# Patient Record
Sex: Male | Born: 1990 | Race: Black or African American | Hispanic: No | Marital: Married | Smoking: Never smoker
Health system: Southern US, Community
[De-identification: ages and names within clinical notes are randomized; demographics above are authoritative.]

## PROBLEM LIST (undated history)

## (undated) DIAGNOSIS — N189 Chronic kidney disease, unspecified: Secondary | ICD-10-CM

## (undated) DIAGNOSIS — I10 Essential (primary) hypertension: Secondary | ICD-10-CM

## (undated) DIAGNOSIS — E119 Type 2 diabetes mellitus without complications: Secondary | ICD-10-CM

## (undated) HISTORY — PX: APPENDECTOMY: SHX54

## (undated) HISTORY — PX: WISDOM TOOTH EXTRACTION: SHX21

---

## 2014-09-15 ENCOUNTER — Other Ambulatory Visit: Payer: Self-pay | Admitting: Sports Medicine

## 2014-09-15 DIAGNOSIS — S233XXS Sprain of ligaments of thoracic spine, sequela: Secondary | ICD-10-CM

## 2014-09-15 DIAGNOSIS — S29012S Strain of muscle and tendon of back wall of thorax, sequela: Principal | ICD-10-CM

## 2014-10-01 ENCOUNTER — Other Ambulatory Visit: Payer: Self-pay

## 2014-10-22 ENCOUNTER — Ambulatory Visit
Admission: RE | Admit: 2014-10-22 | Discharge: 2014-10-22 | Disposition: A | Discharge status: Home / Self Care | Payer: Self-pay | Source: Ambulatory Visit | Attending: Sports Medicine | Admitting: Sports Medicine

## 2014-10-22 DIAGNOSIS — S29012S Strain of muscle and tendon of back wall of thorax, sequela: Principal | ICD-10-CM

## 2014-10-22 DIAGNOSIS — S233XXS Sprain of ligaments of thoracic spine, sequela: Secondary | ICD-10-CM

## 2014-10-25 ENCOUNTER — Other Ambulatory Visit (HOSPITAL_COMMUNITY): Payer: Self-pay | Admitting: Sports Medicine

## 2014-10-25 DIAGNOSIS — IMO0001 Reserved for inherently not codable concepts without codable children: Secondary | ICD-10-CM

## 2014-11-02 NOTE — Pre-Procedure Instructions (Signed)
    Nestor LewandowskyKenneth Crall  11/02/2014      [facility] - [place], [place] - [id number] [place] [id number] Phone: [phone] Fax: [phone]    Your procedure is scheduled on Tuesday, July 12th, at 10:00 AM.  [facility] Ad[facility] at 8:00 A.M.  Call this number if you have problems the morning of surgery:  [phone]   Remember:  Do not eat food or drink liquids after midnight.   Take these medicines the morning of surgery with A SIP OF WATER Amlodipine (Norvasc).  **Please call PCP or endocrinologist about insulin pump rate.**  Stop taking: NSAIDs, Aspirin, Ibuprofen, Aleve, Naproxen, BC's, Goody's, Fish Oil, herbal medications, and vitamins.    Do not wear jewelry.  Do not wear lotions, powders, or colognes.  You may wear deodorant.  Men may shave face and neck.  Do not bring valuables to the hospital.  [facility] is not responsible for any belongings or valuables.  Contacts, dentures or bridgework may not be worn into surgery.  Leave your suitcase in the car.  After surgery it may be brought to your room.  For patients admitted to the hospital, discharge time will be determined by your treatment team.  Patients discharged the day of surgery will not be allowed to drive home.   Special instructions:  See attached.   Please read over the following fact sheets that you were given. Pain Booklet, Coughing and Deep Breathing and Surgical Site Infection Prevention

## 2014-11-03 ENCOUNTER — Encounter (HOSPITAL_COMMUNITY)
Admission: RE | Admit: 2014-11-03 | Discharge: 2014-11-03 | Disposition: A | Discharge status: Home / Self Care | Payer: 59 | Source: Ambulatory Visit | Attending: Sports Medicine | Admitting: Sports Medicine

## 2014-11-03 ENCOUNTER — Encounter (HOSPITAL_COMMUNITY): Payer: Self-pay | Admitting: Emergency Medicine

## 2014-11-03 ENCOUNTER — Encounter (HOSPITAL_COMMUNITY): Payer: Self-pay | Admitting: Anesthesiology

## 2014-11-03 ENCOUNTER — Encounter (HOSPITAL_COMMUNITY): Payer: Self-pay

## 2014-11-03 DIAGNOSIS — I10 Essential (primary) hypertension: Secondary | ICD-10-CM | POA: Insufficient documentation

## 2014-11-03 DIAGNOSIS — E119 Type 2 diabetes mellitus without complications: Secondary | ICD-10-CM | POA: Insufficient documentation

## 2014-11-03 DIAGNOSIS — Z01812 Encounter for preprocedural laboratory examination: Secondary | ICD-10-CM | POA: Diagnosis present

## 2014-11-03 HISTORY — DX: Type 2 diabetes mellitus without complications: E11.9

## 2014-11-03 HISTORY — DX: Essential (primary) hypertension: I10

## 2014-11-03 LAB — BASIC METABOLIC PANEL
ANION GAP: 9 (ref 5–15)
BUN: 13 mg/dL (ref 6–20)
CO2: 26 mmol/L (ref 22–32)
Calcium: 9.2 mg/dL (ref 8.9–10.3)
Chloride: 103 mmol/L (ref 101–111)
Creatinine, Ser: 1.52 mg/dL — ABNORMAL HIGH (ref 0.61–1.24)
GFR calc Af Amer: >60 mL/min (ref >60)
GFR calc non Af Amer: >60 mL/min (ref >60)
Glucose, Bld: 101 mg/dL — ABNORMAL HIGH (ref 65–99)
Potassium: 4.1 mmol/L (ref 3.5–5.1)
Sodium: 138 mmol/L (ref 135–145)

## 2014-11-03 LAB — CBC
HEMATOCRIT: 46.1 % (ref 39.0–52.0)
HEMOGLOBIN: 15.9 g/dL (ref 13.0–17.0)
MCH: 30.8 pg (ref 26.0–34.0)
MCHC: 34.5 g/dL (ref 30.0–36.0)
MCV: 89.3 fL (ref 78.0–100.0)
PLATELETS: 267 10*3/uL (ref 150–400)
RBC: 5.16 MIL/uL (ref 4.22–5.81)
RDW: 12.4 % (ref 11.5–15.5)
WBC: 6.8 10*3/uL (ref 4.0–10.5)

## 2014-11-03 LAB — GLUCOSE, CAPILLARY
Glucose-Capillary: 66 mg/dL (ref 65–99)
Glucose-Capillary: 83 mg/dL (ref 65–99)

## 2014-11-03 NOTE — Progress Notes (Signed)
Pt. Has an insulin pump and states that he will contact his PCP prior to the procedure about adjust rate on pump.  Patient states he had to do that when he had his wisdom teeth removed.

## 2014-11-03 NOTE — Progress Notes (Signed)
Requested H&P from Dr. Hoy RegisterBassett's office.

## 2014-11-03 NOTE — Progress Notes (Signed)
Anesthesia Chart Review:  Pt is 24 year old male scheduled for thoracic spine MRI with anesthesia on 11/08/2014.  PMH includes: type 1 DM (insulin pump), HTN. Never smoker. BMI 30.   Medications include: amlodipine, accutane, lisinopril, novolog.  Pt will contact endocrinologist for guidance on insulin pump for procedure.   Preoperative labs reviewed.  Cr 1.52. HgbA1c pending.   EKG 11/03/2014: Sinus bradycardia (57 bpm). Nonspecific ST abnormality.   There is no H&P on file, but one has been requested from [facility].   Rica Mastngela Cherissa Hook, FNP-BC [facility]/Anesthesiology Phone: [phone] 11/03/2014 12:05 PM

## 2014-11-03 NOTE — Progress Notes (Signed)
PCP- Dr. Louis [facility]ckelParuchuri at [facility] Cardiologist - denies EKG - epic CXR - denies Echo/Stress Test/Cardiac Cath - denies

## 2014-11-03 NOTE — Progress Notes (Signed)
Patient's blood sugar was 66 when arriving to pre-admission appointment.  Patient drank a 4 oz ginger ale.  Rechecked blood sugar prior to leaving pre-admission appointment and patient's blood sugar increased to 83.

## 2014-11-04 LAB — HEMOGLOBIN A1C
HEMOGLOBIN A1C: 7.3 % — AB (ref 4.8–5.6)
MEAN PLASMA GLUCOSE: 163 mg/dL

## 2014-11-08 ENCOUNTER — Encounter (HOSPITAL_COMMUNITY): Admission: RE | Payer: Self-pay | Source: Ambulatory Visit

## 2014-11-08 ENCOUNTER — Ambulatory Visit (HOSPITAL_COMMUNITY): Admission: RE | Admit: 2014-11-08 | Payer: 59 | Source: Ambulatory Visit | Admitting: Sports Medicine

## 2014-11-08 ENCOUNTER — Ambulatory Visit (HOSPITAL_COMMUNITY): Payer: 59

## 2014-11-08 SURGERY — RADIOLOGY WITH ANESTHESIA
Anesthesia: General

## 2014-11-25 ENCOUNTER — Other Ambulatory Visit (HOSPITAL_COMMUNITY): Payer: Self-pay | Admitting: Sports Medicine

## 2014-11-25 DIAGNOSIS — IMO0001 Reserved for inherently not codable concepts without codable children: Secondary | ICD-10-CM

## 2014-12-13 ENCOUNTER — Ambulatory Visit (HOSPITAL_COMMUNITY): Payer: 59

## 2014-12-16 ENCOUNTER — Inpatient Hospital Stay (HOSPITAL_COMMUNITY): Admission: RE | Admit: 2014-12-16 | Payer: 59 | Source: Ambulatory Visit

## 2014-12-19 ENCOUNTER — Encounter (HOSPITAL_COMMUNITY): Payer: Self-pay

## 2014-12-19 ENCOUNTER — Encounter (HOSPITAL_COMMUNITY)
Admission: RE | Admit: 2014-12-19 | Discharge: 2014-12-19 | Disposition: A | Discharge status: Home / Self Care | Payer: 59 | Source: Ambulatory Visit | Attending: Sports Medicine | Admitting: Sports Medicine

## 2014-12-19 ENCOUNTER — Other Ambulatory Visit (HOSPITAL_COMMUNITY): Payer: Self-pay | Admitting: *Deleted

## 2014-12-19 DIAGNOSIS — Z79899 Other long term (current) drug therapy: Secondary | ICD-10-CM | POA: Diagnosis not present

## 2014-12-19 DIAGNOSIS — I129 Hypertensive chronic kidney disease with stage 1 through stage 4 chronic kidney disease, or unspecified chronic kidney disease: Secondary | ICD-10-CM | POA: Diagnosis not present

## 2014-12-19 DIAGNOSIS — R52 Pain, unspecified: Secondary | ICD-10-CM | POA: Diagnosis present

## 2014-12-19 DIAGNOSIS — S22039A Unspecified fracture of third thoracic vertebra, initial encounter for closed fracture: Secondary | ICD-10-CM | POA: Diagnosis not present

## 2014-12-19 DIAGNOSIS — E1022 Type 1 diabetes mellitus with diabetic chronic kidney disease: Secondary | ICD-10-CM | POA: Diagnosis not present

## 2014-12-19 DIAGNOSIS — S22029A Unspecified fracture of second thoracic vertebra, initial encounter for closed fracture: Secondary | ICD-10-CM | POA: Diagnosis not present

## 2014-12-19 DIAGNOSIS — N189 Chronic kidney disease, unspecified: Secondary | ICD-10-CM | POA: Diagnosis not present

## 2014-12-19 DIAGNOSIS — S22049A Unspecified fracture of fourth thoracic vertebra, initial encounter for closed fracture: Secondary | ICD-10-CM | POA: Diagnosis not present

## 2014-12-19 DIAGNOSIS — M419 Scoliosis, unspecified: Secondary | ICD-10-CM | POA: Diagnosis not present

## 2014-12-19 HISTORY — DX: Chronic kidney disease, unspecified: N18.9

## 2014-12-19 LAB — BASIC METABOLIC PANEL
Anion gap: 7 (ref 5–15)
BUN: 9 mg/dL (ref 6–20)
CHLORIDE: 103 mmol/L (ref 101–111)
CO2: 28 mmol/L (ref 22–32)
CREATININE: 1.75 mg/dL — AB (ref 0.61–1.24)
Calcium: 9.2 mg/dL (ref 8.9–10.3)
GFR calc Af Amer: >60 mL/min (ref >60)
GFR calc non Af Amer: 53 mL/min — ABNORMAL LOW (ref >60)
GLUCOSE: 158 mg/dL — AB (ref 65–99)
Potassium: 4.4 mmol/L (ref 3.5–5.1)
SODIUM: 138 mmol/L (ref 135–145)

## 2014-12-19 LAB — CBC
HCT: 44.1 % (ref 39.0–52.0)
Hemoglobin: 15.7 g/dL (ref 13.0–17.0)
MCH: 31.9 pg (ref 26.0–34.0)
MCHC: 35.6 g/dL (ref 30.0–36.0)
MCV: 89.6 fL (ref 78.0–100.0)
PLATELETS: 235 10*3/uL (ref 150–400)
RBC: 4.92 MIL/uL (ref 4.22–5.81)
RDW: 12 % (ref 11.5–15.5)
WBC: 7.5 10*3/uL (ref 4.0–10.5)

## 2014-12-19 LAB — GLUCOSE, CAPILLARY: Glucose-Capillary: 163 mg/dL — ABNORMAL HIGH (ref 65–99)

## 2014-12-19 NOTE — Progress Notes (Signed)
Dr. Noreene Larsson made aware of Creatine of 1.75. Pt has hx of chronic kidney disease.

## 2014-12-19 NOTE — Pre-Procedure Instructions (Signed)
    Theodore Barnes  12/19/2014      Your procedure is scheduled on Tuesday, December 20, 2014 at 10:00 AM.   Report to [facility] Entrance "A" Admitting Office at 8:00 AM.   Call this number if you have problems the morning of surgery [phone]     Remember:  Do not eat food or drink liquids after midnight tonight.  Take these medicines the morning of surgery with A SIP OF WATER: Amlodipine (Norvasc)  How to Manage Your Diabetes Before Surgery   Why is it important to control my blood sugar before and after surgery?   Improving blood sugar levels before and after surgery helps healing and can limit problems.  A way of improving blood sugar control is eating a healthy diet by:  - Eating less sugar and carbohydrates  - Increasing activity/exercise  - Talk with your doctor about reaching your blood sugar goals  High blood sugars (greater than 180 mg/dL) can raise your risk of infections and slow down your recovery so you will need to focus on controlling your diabetes during the weeks before surgery.  Make sure that the doctor who takes care of your diabetes knows about your planned surgery including the date and location.  How do I manage my blood sugars before surgery?   Check your blood sugar at least 4 times a day, 2 days before surgery to make sure that they are not too high or low.    Check your blood sugar the morning of your surgery when you wake up and every 2 hours until you get to the Short-Stay unit.   Treat a low blood sugar (less than 70 mg/dL) with 1/2 cup of clear juice (cranberry or apple), 4 glucose tablets, OR glucose gel.   Recheck blood sugar in 15 minutes after treatment (to make sure it is greater than 70 mg/dL).  If blood sugar is not greater than 70 mg/dL on re-check, call [phone] for further instructions.    Report your blood sugar to the Short-Stay nurse when you get to Short-Stay.  References:  [facility], 2007 "How to Manage your Diabetes Before and After Surgery".  What do I do about my diabetes medications?   For patients with "Insulin Pumps":   Decrease basal insulin rates by 20% at midnight the night before surgery.   Note that if your surgery is planned to be longer than 2 hours, your insulin pump will be removed and intravenous (IV) insulin will be started and managed by the nurses and anesthesiologist.  You will be able to restart your insulin pump once you are awake and able to manage it.   Make sure to bring insulin pump supplies to the hospital with you in case your site needs to be changed.     Do not wear jewelry.  Do not wear lotions, powders, or cologne.  You may wear deodorant.  Men may shave neck and face.   Do not bring valuables to the hospital.  [facility] is not responsible for any belongings or valuables.  Contacts, dentures or bridgework may not be worn into surgery.   Patients discharged the day of surgery will not be allowed to drive home.   Please read over the following fact sheets that you were given. Pain Booklet and Coughing and Deep Breathing

## 2014-12-19 NOTE — Pre-Procedure Instructions (Signed)
Theodore Barnes  12/19/2014      Your procedure is scheduled on Tuesday, December 20, 2014 at 10:00 AM.   Report to [facility] Entrance "A" Admitting Office at 8:00 AM.   Call this number if you have problems the morning of surgery [phone]     Remember:  Do not eat food or drink liquids after midnight tonight.  Take these medicines the morning of surgery with A SIP OF WATER: Amlodipine (Norvasc)  How to Manage Your Diabetes Before Surgery   Why is it important to control my blood sugar before and after surgery?   Improving blood sugar levels before and after surgery helps healing and can limit problems.  A way of improving blood sugar control is eating a healthy diet by:  - Eating less sugar and carbohydrates  - Increasing activity/exercise  - Talk with your doctor about reaching your blood sugar goals  High blood sugars (greater than 180 mg/dL) can raise your risk of infections and slow down your recovery so you will need to focus on controlling your diabetes during the weeks before surgery.  Make sure that the doctor who takes care of your diabetes knows about your planned surgery including the date and location.  How do I manage my blood sugars before surgery?   Check your blood sugar at least 4 times a day, 2 days before surgery to make sure that they are not too high or low.    Check your blood sugar the morning of your surgery when you wake up and every 2 hours until you get to the Short-Stay unit.   Treat a low blood sugar (less than 70 mg/dL) with 1/2 cup of clear juice (cranberry or apple), 4 glucose tablets, OR glucose gel.   Recheck blood sugar in 15 minutes after treatment (to make sure it is greater than 70 mg/dL).  If blood sugar is not greater than 70 mg/dL on re-check, call [phone] for further instructions.    Report your blood sugar to the Short-Stay nurse when you get to Short-Stay.  References:  [facility], 2007 "How to Manage your Diabetes Before and After Surgery".  What do I do about my diabetes medications?   For patients with "Insulin Pumps":   Decrease basal insulin rates by 20% at midnight the night before surgery.   Note that if your surgery is planned to be longer than 2 hours, your insulin pump will be removed and intravenous (IV) insulin will be started and managed by the nurses and anesthesiologist.  You will be able to restart your insulin pump once you are awake and able to manage it.   Make sure to bring insulin pump supplies to the hospital with you in case your site needs to be changed.     Do not wear jewelry, make-up or nail polish.  Do not wear lotions, powders, or perfumes.  You may wear deodorant.  Do not shave 48 hours prior to surgery.    Do not bring valuables to the hospital.  [facility] is not responsible for any belongings or valuables.  Contacts, dentures or bridgework may not be worn into surgery.  Leave your suitcase in the car.  After surgery it may be brought to your room.  For patients admitted to the hospital, discharge time will be determined by your treatment team.  Patients discharged the day of surgery will not be allowed to drive home.   Please read over the  following fact sheets that you were given. Pain Booklet and Coughing and Deep Breathing

## 2014-12-19 NOTE — Progress Notes (Signed)
Left message on voicemail at Dr. Blenda Bridegroom office that we need a H&P for this patient. Pt  Having MRI with anesthesia tomorrow and coming in for PAT today.

## 2014-12-19 NOTE — Progress Notes (Signed)
H&P in chart.  CBG today was 168.

## 2014-12-20 ENCOUNTER — Ambulatory Visit (HOSPITAL_COMMUNITY)
Admission: RE | Admit: 2014-12-20 | Discharge: 2014-12-20 | Disposition: A | Discharge status: Home / Self Care | Payer: 59 | Source: Ambulatory Visit | Attending: Sports Medicine | Admitting: Sports Medicine

## 2014-12-20 ENCOUNTER — Ambulatory Visit (HOSPITAL_COMMUNITY): Payer: 59 | Admitting: Certified Registered"

## 2014-12-20 ENCOUNTER — Encounter (HOSPITAL_COMMUNITY): Payer: Self-pay | Admitting: Certified Registered"

## 2014-12-20 ENCOUNTER — Encounter (HOSPITAL_COMMUNITY)
Admission: RE | Disposition: A | Discharge status: Home / Self Care | Payer: Self-pay | Source: Ambulatory Visit | Attending: Sports Medicine

## 2014-12-20 DIAGNOSIS — I129 Hypertensive chronic kidney disease with stage 1 through stage 4 chronic kidney disease, or unspecified chronic kidney disease: Secondary | ICD-10-CM | POA: Insufficient documentation

## 2014-12-20 DIAGNOSIS — M419 Scoliosis, unspecified: Secondary | ICD-10-CM | POA: Insufficient documentation

## 2014-12-20 DIAGNOSIS — S22039A Unspecified fracture of third thoracic vertebra, initial encounter for closed fracture: Secondary | ICD-10-CM | POA: Insufficient documentation

## 2014-12-20 DIAGNOSIS — IMO0001 Reserved for inherently not codable concepts without codable children: Secondary | ICD-10-CM

## 2014-12-20 DIAGNOSIS — Z79899 Other long term (current) drug therapy: Secondary | ICD-10-CM | POA: Insufficient documentation

## 2014-12-20 DIAGNOSIS — E1022 Type 1 diabetes mellitus with diabetic chronic kidney disease: Secondary | ICD-10-CM | POA: Insufficient documentation

## 2014-12-20 DIAGNOSIS — S22029A Unspecified fracture of second thoracic vertebra, initial encounter for closed fracture: Secondary | ICD-10-CM | POA: Diagnosis not present

## 2014-12-20 DIAGNOSIS — N189 Chronic kidney disease, unspecified: Secondary | ICD-10-CM | POA: Insufficient documentation

## 2014-12-20 DIAGNOSIS — S22049A Unspecified fracture of fourth thoracic vertebra, initial encounter for closed fracture: Secondary | ICD-10-CM | POA: Insufficient documentation

## 2014-12-20 HISTORY — PX: RADIOLOGY WITH ANESTHESIA: SHX6223

## 2014-12-20 LAB — GLUCOSE, CAPILLARY
Glucose-Capillary: 228 mg/dL — ABNORMAL HIGH (ref 65–99)
Glucose-Capillary: 276 mg/dL — ABNORMAL HIGH (ref 65–99)

## 2014-12-20 SURGERY — RADIOLOGY WITH ANESTHESIA
Anesthesia: Monitor Anesthesia Care

## 2014-12-20 MED ORDER — LACTATED RINGERS IV SOLN: INTRAVENOUS | Status: DC

## 2014-12-20 MED ORDER — PROMETHAZINE HCL 25 MG/ML IJ SOLN: 6.2500 mg | INTRAMUSCULAR | Status: DC | PRN

## 2014-12-20 MED ORDER — MEPERIDINE HCL 25 MG/ML IJ SOLN: 6.2500 mg | INTRAMUSCULAR | Status: DC | PRN

## 2014-12-20 NOTE — Progress Notes (Signed)
Dr Hart Rochester at bedside-aware CBG 276-no new orders, wiffe will resume pt's insulin pump therapy.

## 2014-12-20 NOTE — Anesthesia Preprocedure Evaluation (Addendum)
Anesthesia Evaluation  Patient identified by MRN, date of birth, ID band Patient awake    Reviewed: Allergy & Precautions, NPO status , Patient's Chart, lab work & pertinent test results  Airway Mallampati: I   Neck ROM: Full    Dental  (+) Teeth Intact   Pulmonary neg pulmonary ROS,  breath sounds clear to auscultation        Cardiovascular hypertension, Pt. on medications Rhythm:Regular Rate:Normal     Neuro/Psych negative neurological ROS  negative psych ROS   GI/Hepatic negative GI ROS,   Endo/Other  diabetes, Type 1, Insulin Dependent  Renal/GU CRFRenal disease  negative genitourinary   Musculoskeletal negative musculoskeletal ROS (+)   Abdominal   Peds negative pediatric ROS (+)  Hematology negative hematology ROS (+)   Anesthesia Other Findings   Reproductive/Obstetrics negative OB ROS                            Lab Results  Component Value Date   WBC 7.5 12/19/2014   HGB 15.7 12/19/2014   HCT 44.1 12/19/2014   MCV 89.6 12/19/2014   PLT 235 12/19/2014   Lab Results  Component Value Date   CREATININE 1.75* 12/19/2014   BUN 9 12/19/2014   NA 138 12/19/2014   K 4.4 12/19/2014   CL 103 12/19/2014   CO2 28 12/19/2014   No results found for: INR, PROTIME   11/03/2014: EKG: sinus bradycardia.   Anesthesia Physical Anesthesia Plan  ASA: II  Anesthesia Plan: MAC   Post-op Pain Management:    Induction: Intravenous  Airway Management Planned: Natural Airway and Simple Face Mask  Additional Equipment:   Intra-op Plan:   Post-operative Plan:   Informed Consent: I have reviewed the patients History and Physical, chart, labs and discussed the procedure including the risks, benefits and alternatives for the proposed anesthesia with the patient or authorized representative who has indicated his/her understanding and acceptance.   Dental advisory given  Plan Discussed  with: CRNA  Anesthesia Plan Comments:         Anesthesia Quick Evaluation

## 2014-12-20 NOTE — Transfer of Care (Signed)
Immediate Anesthesia Transfer of Care Note  Patient: Theodore Barnes  Procedure(s) Performed: Procedure(s) with comments: MRI THORACIC SPINE WITHOUT CONTRAST (N/A) - DR. KRAMER/MRI  Patient Location: PACU  Anesthesia Type:MAC  Level of Consciousness: sedated and patient cooperative  Airway & Oxygen Therapy: Patient Spontanous Breathing and Patient connected to nasal cannula oxygen  Post-op Assessment: Report given to RN, Post -op Vital signs reviewed and stable and Patient moving all extremities  Post vital signs: Reviewed and stable  Last Vitals:  Filed Vitals:   12/20/14 1102  BP:   Pulse: 94  Temp:   Resp: 23    Complications: No apparent anesthesia complications

## 2014-12-20 NOTE — Progress Notes (Signed)
Patient put insulin pump back on and delivered 4.76 units to self to cover CBG

## 2014-12-21 ENCOUNTER — Encounter (HOSPITAL_COMMUNITY): Payer: Self-pay | Admitting: Radiology

## 2014-12-21 NOTE — Anesthesia Postprocedure Evaluation (Signed)
  Anesthesia Post-op Note  Patient: Theodore Barnes  Procedure(s) Performed: Procedure(s) with comments: MRI THORACIC SPINE WITHOUT CONTRAST (N/A) - DR. KRAMER/MRI  Patient Location: PACU  Anesthesia Type:MAC  Level of Consciousness: awake and alert   Airway and Oxygen Therapy: Patient Spontanous Breathing  Post-op Pain: none  Post-op Assessment: Post-op Vital signs reviewed and Patient's Cardiovascular Status Stable              Post-op Vital Signs: Reviewed and stable  Last Vitals:  Filed Vitals:   12/20/14 1126  BP: 121/79  Pulse: 71  Temp:   Resp: 18    Complications: No apparent anesthesia complications

## 2014-12-23 MED FILL — Propofol IV Emul 200 MG/20ML (10 MG/ML): INTRAVENOUS | Qty: 20 | Status: AC

## 2014-12-23 MED FILL — Lidocaine HCl IV Inj 20 MG/ML: INTRAVENOUS | Qty: 5 | Status: AC

## 2014-12-28 MED FILL — Propofol IV Emul 200 MG/20ML (10 MG/ML): INTRAVENOUS | Qty: 20 | Status: AC

## 2015-05-30 ENCOUNTER — Emergency Department (HOSPITAL_BASED_OUTPATIENT_CLINIC_OR_DEPARTMENT_OTHER)
Admission: EM | Admit: 2015-05-30 | Discharge: 2015-05-31 | Disposition: A | Discharge status: Home / Self Care | Payer: BLUE CROSS/BLUE SHIELD | Attending: Emergency Medicine | Admitting: Emergency Medicine

## 2015-05-30 ENCOUNTER — Encounter (HOSPITAL_BASED_OUTPATIENT_CLINIC_OR_DEPARTMENT_OTHER): Payer: Self-pay | Admitting: Emergency Medicine

## 2015-05-30 DIAGNOSIS — G5762 Lesion of plantar nerve, left lower limb: Secondary | ICD-10-CM

## 2015-05-30 DIAGNOSIS — I129 Hypertensive chronic kidney disease with stage 1 through stage 4 chronic kidney disease, or unspecified chronic kidney disease: Secondary | ICD-10-CM | POA: Insufficient documentation

## 2015-05-30 DIAGNOSIS — Z79899 Other long term (current) drug therapy: Secondary | ICD-10-CM | POA: Diagnosis not present

## 2015-05-30 DIAGNOSIS — M79672 Pain in left foot: Secondary | ICD-10-CM | POA: Diagnosis present

## 2015-05-30 DIAGNOSIS — Z794 Long term (current) use of insulin: Secondary | ICD-10-CM | POA: Diagnosis not present

## 2015-05-30 DIAGNOSIS — N182 Chronic kidney disease, stage 2 (mild): Secondary | ICD-10-CM | POA: Diagnosis not present

## 2015-05-30 DIAGNOSIS — E109 Type 1 diabetes mellitus without complications: Secondary | ICD-10-CM | POA: Insufficient documentation

## 2015-05-30 NOTE — ED Notes (Signed)
Pt complaining of pain under left foot ball area. Denies injury.

## 2015-05-31 NOTE — Discharge Instructions (Signed)
Morton Neuralgia  Morton neuralgia is a type of foot pain in the area closest to your toes. This area is sometimes called the ball of your foot. Morton neuralgia occurs when a branch of a nerve in your foot (digital nerve) becomes compressed.   When this happens over a long period of time, the nerve can thicken (neuroma) and cause pain. This usually occurs between the third and fourth toe. Morton neuralgia can come and go but may get worse over time.   CAUSES  Your digital nerve can become compressed and stretched at a point where it passes under a thick band of tissue that connects your toes (intermetatarsal ligament). Morton neuralgia can be caused by mild repetitive damage in this area. This type of damage can result from:   · Activities such as running or jumping.  · Wearing shoes that are too tight.  RISK FACTORS  You may be at risk for Morton neuralgia if you:  · Are male.  · Wear high heels.  · Wear shoes that are narrow or tight.  · Participate in activities that stretch your toes. These include:  ¨ Running.  ¨ Ballet.  ¨ Long-distance walking.  SIGNS AND SYMPTOMS  The first symptom of Morton neuralgia is pain that spreads from the ball of your foot to your toes. It may feel like you are walking on a marble. Pain usually gets worse with walking and goes away at night. Other symptoms may include numbness and cramping of your toes.  DIAGNOSIS   Your health care provider will do a physical exam. When doing the exam, your health care provider may:   · Squeeze your foot just behind your toe.  · Ask you to move your toes to check for pain.  You may also have tests on your foot to confirm the diagnosis. These may include:   · An X-ray.  · An MRI.  TREATMENT   Treatment for Morton neuralgia may be as simple as changing the kind of shoes you wear. Other treatments may include:  · Wearing a supportive pad (orthosis) under the front of your foot. This lifts your toe bones and takes pressure off the nerve.  · Getting  injections of numbing medicine and anti-inflammatory medicine (steroid) in the nerve.  · Having surgery to remove part of the thickened nerve.  HOME CARE INSTRUCTIONS   · Take medicine only as directed by your health care provider.  · Wear soft-soled shoes with a wide toe area.  · Stop activities that may be causing pain.  · Elevate your foot when resting.  · Massage your foot.  · Apply ice to the injured area:      Put ice in a plastic bag.    Place a towel between your skin and the bag.    Leave the ice on for 20 minutes, 2-3 times a day.    · Keep all follow-up visits as directed by your health care provider. This is important.  SEEK MEDICAL CARE IF:  · Home care instructions are not helping you get better.  · Your symptoms change or get worse.     This information is not intended to replace advice given to you by your health care provider. Make sure you discuss any questions you have with your health care provider.     Document Released: 07/22/2000 Document Revised: 05/06/2014 Document Reviewed: 06/16/2013  Elsevier Interactive Patient Education ©2016 [employer]

## 2015-05-31 NOTE — ED Provider Notes (Signed)
CSN: [id number]     Arrival date & time 05/30/15  2315 History   First MD Initiated Contact with Patient 05/31/15 0025     Chief Complaint  Patient presents with  . Foot Pain     (Consider location/radiation/quality/duration/timing/severity/associated sxs/prior Treatment) HPI  This is a 25 year old male with a history of diabetes. He is here with pain in his left foot for the past several days. The pain is located just proximal to his left second and third toes and feels like there is a knot or a pebble in his shoe when he walks. Pain is mild to moderate with ambulation, minimal at rest. He denies injury to those toes. He has not taken anything for it.  Past Medical History  Diagnosis Date  . Diabetes mellitus without complication (HCC)     Type 1; pt. has insulin pump  . Hypertension     pt. takes Amlodipine and Lisinopril  . Chronic kidney disease     Stage 2 per PCP office visit note   Past Surgical History  Procedure Laterality Date  . Appendectomy    . Wisdom tooth extraction    . Radiology with anesthesia N/A 12/20/2014    Procedure: MRI THORACIC SPINE WITHOUT CONTRAST;  Surgeon: Medication Radiologist, MD;  Location: [facility] OR;  Service: Radiology;  Laterality: N/A;  DR. KRAMER/MRI   Family History  Problem Relation Age of Onset  . Asthma Mother   . Hypertension Father    Social History  Substance Use Topics  . Smoking status: Never Smoker   . Smokeless tobacco: Never Used  . Alcohol Use: No    Review of Systems  All other systems reviewed and are negative.   Allergies  Review of patient's allergies indicates no known allergies.  Home Medications   Prior to Admission medications   Medication Sig Start Date End Date Taking? Authorizing Provider  amLODipine (NORVASC) 10 MG tablet Take 10 mg by mouth daily. 08/16/14  Yes Historical Provider, MD  lisinopril (PRINIVIL,ZESTRIL) 20 MG tablet Take 20 mg by mouth daily.   Yes Historical Provider, MD  NOVOLOG 100 UNIT/ML  injection Insulin pump 09/05/14  Yes Historical Provider, MD  glucosamine-chondroitin 500-400 MG tablet Take 2 tablets by mouth daily.    Historical Provider, MD  MAGNESIUM PO Take 1 tablet by mouth daily.    Historical Provider, MD   BP 149/92 mmHg  Pulse 106  Temp(Src) 98.1 F (36.7 C) (Oral)  Resp 18  Ht  (1.676 m)  Wt 187 lb (84.823 kg)  BMI 30.20 kg/m2  SpO2 98%   Physical Exam  General: Well-developed, well-nourished male in no acute distress; appearance consistent with age of record HENT: normocephalic; atraumatic Eyes: pupils equal, round and reactive to light; extraocular muscles intact Neck: supple Heart: regular rate and rhythm; no murmurs, rubs or gallops Lungs: clear to auscultation bilaterally Abdomen: soft; nondistended Extremities: No deformity; full range of motion; pulses normal; mild tenderness of the sole of the left foot just proximal to the second and third toes Neurologic: Awake, alert and oriented; motor function intact in all extremities and symmetric; no facial droop Skin: Warm and dry Psychiatric: Normal mood and affect    ED Course  Procedures (including critical care time)   MDM  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 We will refer to podiatry. He was advised that there are over-the-counter shoe inserts that can help relieve pressure in the meantime.     Cleve Paolillo,  MD 05/31/15 4098

## 2015-05-31 NOTE — ED Notes (Signed)
Given d/c instructions. Verbalizes understanding. No questions. 

## 2015-06-20 ENCOUNTER — Emergency Department (HOSPITAL_BASED_OUTPATIENT_CLINIC_OR_DEPARTMENT_OTHER)
Admission: EM | Admit: 2015-06-20 | Discharge: 2015-06-20 | Disposition: A | Discharge status: Home / Self Care | Payer: BLUE CROSS/BLUE SHIELD | Attending: Emergency Medicine | Admitting: Emergency Medicine

## 2015-06-20 ENCOUNTER — Encounter (HOSPITAL_BASED_OUTPATIENT_CLINIC_OR_DEPARTMENT_OTHER): Payer: Self-pay

## 2015-06-20 DIAGNOSIS — Z79899 Other long term (current) drug therapy: Secondary | ICD-10-CM | POA: Diagnosis not present

## 2015-06-20 DIAGNOSIS — M25562 Pain in left knee: Secondary | ICD-10-CM | POA: Diagnosis not present

## 2015-06-20 DIAGNOSIS — M25561 Pain in right knee: Secondary | ICD-10-CM | POA: Diagnosis present

## 2015-06-20 DIAGNOSIS — L03115 Cellulitis of right lower limb: Secondary | ICD-10-CM | POA: Insufficient documentation

## 2015-06-20 DIAGNOSIS — N182 Chronic kidney disease, stage 2 (mild): Secondary | ICD-10-CM | POA: Insufficient documentation

## 2015-06-20 DIAGNOSIS — Z794 Long term (current) use of insulin: Secondary | ICD-10-CM | POA: Insufficient documentation

## 2015-06-20 DIAGNOSIS — E1122 Type 2 diabetes mellitus with diabetic chronic kidney disease: Secondary | ICD-10-CM | POA: Insufficient documentation

## 2015-06-20 DIAGNOSIS — I129 Hypertensive chronic kidney disease with stage 1 through stage 4 chronic kidney disease, or unspecified chronic kidney disease: Secondary | ICD-10-CM | POA: Insufficient documentation

## 2015-06-20 MED ORDER — CEPHALEXIN 500 MG PO CAPS: 1000.0000 mg | ORAL_CAPSULE | Freq: Two times a day (BID) | ORAL | Status: AC

## 2015-06-20 MED ORDER — SULFAMETHOXAZOLE-TRIMETHOPRIM 800-160 MG PO TABS: 1.0000 | ORAL_TABLET | Freq: Two times a day (BID) | ORAL | Status: AC

## 2015-06-20 MED ORDER — TRAMADOL HCL 50 MG PO TABS: 50.0000 mg | ORAL_TABLET | Freq: Four times a day (QID) | ORAL | Status: AC | PRN

## 2015-06-20 MED FILL — traMADol HCL 50 MG TABS: 50 | 2 days supply | Qty: 8 | Fill #0

## 2015-06-20 MED FILL — SULFAMETHOXAZOLE-TMP DS TAB: 800-160 | 7 days supply | Qty: 14 | Fill #0

## 2015-06-20 MED FILL — CEPHALEXIN 500 MG CAPSULE: 500 | 10 days supply | Qty: 40 | Fill #0

## 2015-06-20 NOTE — ED Provider Notes (Signed)
CSN: [id number]     Arrival date & time 06/20/15  1513 History   First MD Initiated Contact with Patient 06/20/15 1605     Chief Complaint  Patient presents with  . Insect Bite     (Consider location/radiation/quality/duration/timing/severity/associated sxs/prior Treatment) HPI   25 year old male with history of insulin-dependent diabetes, hypertension, chronic kidney disease presents for evaluation of left knee pain. Patient reports since 2 days ago he has noticed pain redness and swelling to the anterior aspects of his right knee yesterday when squeezing he was able to express a small amount of drainage. He believes may be due to an insect bite. He described pain as a sharp throbbing sensation, nonradiating worsening with palpation. Pain is moderate. He is up-to-date with tetanus. He denies any fever numbness or weakness. No other complaint.-  Past Medical History  Diagnosis Date  . Diabetes mellitus without complication (HCC)     Type 1; pt. has insulin pump  . Hypertension     pt. takes Amlodipine and Lisinopril  . Chronic kidney disease     Stage 2 per PCP office visit note   Past Surgical History  Procedure Laterality Date  . Appendectomy    . Wisdom tooth extraction    . Radiology with anesthesia N/A 12/20/2014    Procedure: MRI THORACIC SPINE WITHOUT CONTRAST;  Surgeon: Medication Radiologist, MD;  Location: [facility] OR;  Service: Radiology;  Laterality: N/A;  DR. KRAMER/MRI   Family History  Problem Relation Age of Onset  . Asthma Mother   . Hypertension Father    Social History  Substance Use Topics  . Smoking status: Never Smoker   . Smokeless tobacco: Never Used  . Alcohol Use: No    Review of Systems  Constitutional: Negative for fever.  Skin: Positive for rash.  Neurological: Negative for numbness.      Allergies  Review of patient's allergies indicates no known allergies.  Home Medications   Prior to Admission medications   Medication Sig Start Date End  Date Taking? Authorizing Provider  amLODipine (NORVASC) 10 MG tablet Take 10 mg by mouth daily. 08/16/14   Historical Provider, MD  glucosamine-chondroitin 500-400 MG tablet Take 2 tablets by mouth daily.    Historical Provider, MD  lisinopril (PRINIVIL,ZESTRIL) 20 MG tablet Take 20 mg by mouth daily.    Historical Provider, MD  MAGNESIUM PO Take 1 tablet by mouth daily.    Historical Provider, MD  NOVOLOG 100 UNIT/ML injection Insulin pump 09/05/14   Historical Provider, MD   BP 129/76 mmHg  Pulse 88  Temp(Src) 99 F (37.2 C) (Oral)  Resp 16  Ht  (1.702 m)  Wt 86.183 kg  BMI 29.75 kg/m2  SpO2 99% Physical Exam  Constitutional: He appears well-developed and well-nourished. No distress.  HENT:  Head: Atraumatic.  Eyes: Conjunctivae are normal.  Neck: Neck supple.  Musculoskeletal: He exhibits tenderness (Right knee: In the anterior knee there is an area of induration without any fluctuance, there is erythematous tenderness to palpation and an overlying scab were noted. No joint involvement. Knee with full range of motion.).  Neurological: He is alert.  Skin: No rash noted.  Psychiatric: He has a normal mood and affect.  Nursing note and vitals reviewed.   ED Course  Procedures (including critical care time) Labs Review Labs Reviewed - No data to display  Imaging Review No results found. I have personally reviewed and evaluated these images and lab results as part of my medical decision-making.  EKG Interpretation None      MDM   Final diagnoses:  Cellulitis of knee, right    BP 129/76 mmHg  Pulse 88  Temp(Src) 99 F (37.2 C) (Oral)  Resp 16  Ht  (1.702 m)  Wt 86.183 kg  BMI 29.75 kg/m2  SpO2 99%   4:13 PM Patient presents with suspected insect bite to his anterior right knee. It appears to be cellulitic skin changes with potential small early forming abscess not amenable for incision and drainage at this time. No joint involvement and no suspicion  for septic joint. I discussed options of antibiotic, warm compress versus I&D and patient prefers antibiotic first. Patient understand to return for wound recheck in 48 hours if no improvement. He is up-to-date with immunization.  Fayrene Helper, PA-C 06/20/15 1618  Gwyneth Sprout, MD 06/21/15 2003

## 2015-06-20 NOTE — ED Notes (Signed)
?   Insect bite to left knee x 2 days-c/o redness with d/c x today-NAD-steady gait

## 2015-06-20 NOTE — Discharge Instructions (Signed)
Apply warm compress and take antibiotics as prescribed.  Return in 48 hrs for wound recheck if no improvement.   Cellulitis Cellulitis is an infection of the skin and the tissue beneath it. The infected area is usually red and tender. Cellulitis occurs most often in the arms and lower legs.  CAUSES  Cellulitis is caused by bacteria that enter the skin through cracks or cuts in the skin. The most common types of bacteria that cause cellulitis are staphylococci and streptococci. SIGNS AND SYMPTOMS   Redness and warmth.  Swelling.  Tenderness or pain.  Fever. DIAGNOSIS  Your health care provider can usually determine what is wrong based on a physical exam. Blood tests may also be done. TREATMENT  Treatment usually involves taking an antibiotic medicine. HOME CARE INSTRUCTIONS   Take your antibiotic medicine as directed by your health care provider. Finish the antibiotic even if you start to feel better.  Keep the infected arm or leg elevated to reduce swelling.  Apply a warm cloth to the affected area up to 4 times per day to relieve pain.  Take medicines only as directed by your health care provider.  Keep all follow-up visits as directed by your health care provider. SEEK MEDICAL CARE IF:   You notice red streaks coming from the infected area.  Your red area gets larger or turns dark in color.  Your bone or joint underneath the infected area becomes painful after the skin has healed.  Your infection returns in the same area or another area.  You notice a swollen bump in the infected area.  You develop new symptoms.  You have a fever. SEEK IMMEDIATE MEDICAL CARE IF:   You feel very sleepy.  You develop vomiting or diarrhea.  You have a general ill feeling (malaise) with muscle aches and pains.   This information is not intended to replace advice given to you by your health care provider. Make sure you discuss any questions you have with your health care provider.   Document Released: 01/23/2005 Document Revised: 01/04/2015 Document Reviewed: 07/01/2011 Elsevier Interactive Patient Education [employer]

## 2015-06-23 ENCOUNTER — Encounter (HOSPITAL_BASED_OUTPATIENT_CLINIC_OR_DEPARTMENT_OTHER): Payer: Self-pay | Admitting: *Deleted

## 2015-06-23 ENCOUNTER — Emergency Department (HOSPITAL_BASED_OUTPATIENT_CLINIC_OR_DEPARTMENT_OTHER)
Admission: EM | Admit: 2015-06-23 | Discharge: 2015-06-23 | Disposition: A | Discharge status: Home / Self Care | Payer: BLUE CROSS/BLUE SHIELD | Attending: Emergency Medicine | Admitting: Emergency Medicine

## 2015-06-23 DIAGNOSIS — Z79899 Other long term (current) drug therapy: Secondary | ICD-10-CM | POA: Insufficient documentation

## 2015-06-23 DIAGNOSIS — Z794 Long term (current) use of insulin: Secondary | ICD-10-CM | POA: Insufficient documentation

## 2015-06-23 DIAGNOSIS — I129 Hypertensive chronic kidney disease with stage 1 through stage 4 chronic kidney disease, or unspecified chronic kidney disease: Secondary | ICD-10-CM | POA: Diagnosis not present

## 2015-06-23 DIAGNOSIS — Z792 Long term (current) use of antibiotics: Secondary | ICD-10-CM | POA: Insufficient documentation

## 2015-06-23 DIAGNOSIS — N189 Chronic kidney disease, unspecified: Secondary | ICD-10-CM | POA: Diagnosis not present

## 2015-06-23 DIAGNOSIS — E119 Type 2 diabetes mellitus without complications: Secondary | ICD-10-CM | POA: Diagnosis not present

## 2015-06-23 DIAGNOSIS — L03115 Cellulitis of right lower limb: Secondary | ICD-10-CM | POA: Diagnosis not present

## 2015-06-23 DIAGNOSIS — M7989 Other specified soft tissue disorders: Secondary | ICD-10-CM | POA: Diagnosis present

## 2015-06-23 LAB — CBG MONITORING, ED: Glucose-Capillary: 189 mg/dL — ABNORMAL HIGH (ref 65–99)

## 2015-06-23 NOTE — ED Notes (Signed)
Seen here on 2/21 and dx with cellulitis. Reports leg is more swollen and red

## 2015-06-23 NOTE — ED Notes (Signed)
Pt is a type 1 diabetic

## 2015-06-23 NOTE — ED Provider Notes (Signed)
CSN: [id number]     Arrival date & time 06/23/15  1905 History  By signing my name below, I, Octavia Heir, attest that this documentation has been prepared under the direction and in the presence of Marily Memos, MD. Electronically Signed: Octavia Heir, ED Scribe. 06/23/2015. 8:19 PM.    Chief Complaint  Patient presents with  . Leg Swelling      The history is provided by the patient. No language interpreter was used.   HPI Comments: Theodore Barnes is a 25 y.o. male who has DMI presents to the Emergency Department complaining of constant, gradual worsening, moderate, right knee swelling and erythema onset 4 days ago. Pt was seen on 2/21 for an insect bite on his right knee. He received some antibiotics but states he has not gotten any relief. He notes he is constantly moving and that could be the reason for the irritation in his knee. Pt endorses having pain when he goes a long time without moving his knee to walking that it feels like right knee "shifted or dislocated". Pt does not have a prior history of having infections. He denies fever, visits to the hospital or nursing home recently.   Past Medical History  Diagnosis Date  . Diabetes mellitus without complication (HCC)     Type 1; pt. has insulin pump  . Hypertension     pt. takes Amlodipine and Lisinopril  . Chronic kidney disease     Stage 2 per PCP office visit note   Past Surgical History  Procedure Laterality Date  . Appendectomy    . Wisdom tooth extraction    . Radiology with anesthesia N/A 12/20/2014    Procedure: MRI THORACIC SPINE WITHOUT CONTRAST;  Surgeon: Medication Radiologist, MD;  Location: [facility] OR;  Service: Radiology;  Laterality: N/A;  DR. KRAMER/MRI   Family History  Problem Relation Age of Onset  . Asthma Mother   . Hypertension Father    Social History  Substance Use Topics  . Smoking status: Never Smoker   . Smokeless tobacco: Never Used  . Alcohol Use: No    Review of Systems  Constitutional:  Negative for fever.  Musculoskeletal: Positive for arthralgias.  All other systems reviewed and are negative.     Allergies  Review of patient's allergies indicates no known allergies.  Home Medications   Prior to Admission medications   Medication Sig Start Date End Date Taking? Authorizing Provider  amLODipine (NORVASC) 10 MG tablet Take 10 mg by mouth daily. 08/16/14  Yes Historical Provider, MD  cephALEXin (KEFLEX) 500 MG capsule Take 2 capsules (1,000 mg total) by mouth 2 (two) times daily. 06/20/15  Yes Fayrene Helper, PA-C  lisinopril (PRINIVIL,ZESTRIL) 20 MG tablet Take 20 mg by mouth daily.   Yes Historical Provider, MD  NOVOLOG 100 UNIT/ML injection Insulin pump 09/05/14  Yes Historical Provider, MD  sulfamethoxazole-trimethoprim (BACTRIM DS,SEPTRA DS) 800-160 MG tablet Take 1 tablet by mouth 2 (two) times daily. 06/20/15 06/27/15 Yes Fayrene Helper, PA-C  glucosamine-chondroitin 500-400 MG tablet Take 2 tablets by mouth daily.    Historical Provider, MD  MAGNESIUM PO Take 1 tablet by mouth daily.    Historical Provider, MD  traMADol (ULTRAM) 50 MG tablet Take 1 tablet (50 mg total) by mouth every 6 (six) hours as needed. 06/20/15   Fayrene Helper, PA-C   Triage vitals: BP 149/77 mmHg  Pulse 108  Temp(Src) 99.3 F (37.4 C) (Oral)  Resp 16  Ht  (1.702 m)  Wt 190 lb (  86.183 kg)  BMI 29.75 kg/m2  SpO2 100% Physical Exam  Constitutional: He is oriented to person, place, and time. He appears well-developed and well-nourished.  HENT:  Head: Normocephalic.  Eyes: EOM are normal.  Neck: Normal range of motion.  Pulmonary/Chest: Effort normal.  Abdominal: He exhibits no distension.  Musculoskeletal: Normal range of motion. He exhibits edema and tenderness.  Erythema on right knee that is hot, slightly TTP and edematous. No pain with ROM or bearing weight on knee, mild swelling in proximal tibia area, but no tenderness in the area, No fluctuance, no induration  Neurological: He is alert  and oriented to person, place, and time.  Psychiatric: He has a normal mood and affect.  Nursing note and vitals reviewed.   ED Course  Procedures   ULTRASOUND LIMITED SOFT TISSUE Indication: erythema and pain, eval for cellulitis or abscess Linear probe used to evaluate area of interest in two planes. Findings:  No fluid collection. Cobblestoning was present c/w cellulitis.  Performed by: Dr Clayborne Dana Images saved electronically   DIAGNOSTIC STUDIES: Oxygen Saturation is 100% on RA, normal by my interpretation.  COORDINATION OF CARE:  8:02 PM Discussed treatment plan which includes continue antibiotics and if not better by tomorrow around the same time, get knee evaluated again with pt at bedside and pt agreed to plan.   Labs Review Labs Reviewed  CBG MONITORING, ED - Abnormal; Notable for the following:    Glucose-Capillary 189 (*)    All other components within normal limits    Imaging Review No results found. I have personally reviewed and evaluated these images and lab results as part of my medical decision-making.   EKG Interpretation None      MDM   Final diagnoses:  Cellulitis of right lower extremity    On keflex/bactrim for right leg cellulitis. Been three days without significant improvement. He also states that his leg seems to be more swollen along with it. Examination as above. Ultrasound at bedside showed cellulitis without evidence of abscess. The area is marked I would suspect that the symptoms should start to improve by tomorrow I discussed with him that if they didn't he may need to come back for possible IV antibiotics and/or admission. I marked the area with a marker and told him if it got outside of that at any point to return here. But if it hadn't significant improved at least to the point of the.by tomorrow that he needed to return for a recheck. No evidence of sepsis. No evidence of abscess on ultrasound. I personally performed the services  described in this documentation, which was scribed in my presence. The recorded information has been reviewed and is accurate.    Marily Memos, MD 06/23/15 2330

## 2015-09-05 ENCOUNTER — Emergency Department (HOSPITAL_BASED_OUTPATIENT_CLINIC_OR_DEPARTMENT_OTHER)
Admission: EM | Admit: 2015-09-05 | Discharge: 2015-09-05 | Disposition: A | Discharge status: Home / Self Care | Payer: BLUE CROSS/BLUE SHIELD | Attending: Emergency Medicine | Admitting: Emergency Medicine

## 2015-09-05 DIAGNOSIS — I129 Hypertensive chronic kidney disease with stage 1 through stage 4 chronic kidney disease, or unspecified chronic kidney disease: Secondary | ICD-10-CM | POA: Diagnosis not present

## 2015-09-05 DIAGNOSIS — E1022 Type 1 diabetes mellitus with diabetic chronic kidney disease: Secondary | ICD-10-CM | POA: Diagnosis not present

## 2015-09-05 DIAGNOSIS — Z794 Long term (current) use of insulin: Secondary | ICD-10-CM | POA: Insufficient documentation

## 2015-09-05 DIAGNOSIS — L237 Allergic contact dermatitis due to plants, except food: Secondary | ICD-10-CM | POA: Insufficient documentation

## 2015-09-05 DIAGNOSIS — R21 Rash and other nonspecific skin eruption: Secondary | ICD-10-CM | POA: Diagnosis present

## 2015-09-05 DIAGNOSIS — N182 Chronic kidney disease, stage 2 (mild): Secondary | ICD-10-CM | POA: Insufficient documentation

## 2015-09-05 MED ORDER — CETIRIZINE HCL 5 MG/5ML PO SYRP
10.0000 mg | ORAL_SOLUTION | Freq: Once | ORAL | Status: AC
Start: 2015-09-05 — End: 2015-09-05
  Administered 2015-09-05: 10 mg via ORAL
  Filled 2015-09-05: qty 10

## 2015-09-05 MED ORDER — TRIAMCINOLONE ACETONIDE 0.1 % EX CREA: TOPICAL_CREAM | CUTANEOUS | Status: AC

## 2015-09-05 MED ORDER — CETIRIZINE HCL 10 MG PO TABS: ORAL_TABLET | ORAL | Status: AC

## 2015-09-05 NOTE — ED Notes (Signed)
Pt thinks he came into contact with poison ivy or poison oak on Sunday while chopping down trees, has itchy rash on bilateral forearms that is not relieved by OTC calomine lotion

## 2015-09-05 NOTE — ED Notes (Signed)
Pt verbalizes understanding of d/c instructions and denies any further needs at this time. 

## 2015-09-05 NOTE — ED Provider Notes (Signed)
CSN: [id number]     Arrival date & time 09/05/15  0304 History   First MD Initiated Contact with Patient 09/05/15 [phone]     Chief Complaint  Patient presents with  . Rash     (Consider location/radiation/quality/duration/timing/severity/associated sxs/prior Treatment) HPI  This is a 25 year old male who developed a pruritic, maculopapular and vesicular rash of his forearms after working with some trees 3 days ago. He believes he has poison oak. He has been applying calamine lotion without relief. He denies any systemic symptoms such as shortness of breath, cough, fever, chills, nausea, vomiting or diarrhea.  Past Medical History  Diagnosis Date  . Diabetes mellitus without complication (HCC)     Type 1; pt. has insulin pump  . Hypertension     pt. takes Amlodipine and Lisinopril  . Chronic kidney disease     Stage 2 per PCP office visit note   Past Surgical History  Procedure Laterality Date  . Appendectomy    . Wisdom tooth extraction    . Radiology with anesthesia N/A 12/20/2014    Procedure: MRI THORACIC SPINE WITHOUT CONTRAST;  Surgeon: Medication Radiologist, MD;  Location: [facility] OR;  Service: Radiology;  Laterality: N/A;  DR. KRAMER/MRI   Family History  Problem Relation Age of Onset  . Asthma Mother   . Hypertension Father    Social History  Substance Use Topics  . Smoking status: Never Smoker   . Smokeless tobacco: Never Used  . Alcohol Use: No    Review of Systems  All other systems reviewed and are negative.   Allergies  Review of patient's allergies indicates no known allergies.  Home Medications   Prior to Admission medications   Medication Sig Start Date End Date Taking? Authorizing Provider  amLODipine (NORVASC) 10 MG tablet Take 10 mg by mouth daily. 08/16/14   Historical Provider, MD  cephALEXin (KEFLEX) 500 MG capsule Take 2 capsules (1,000 mg total) by mouth 2 (two) times daily. 06/20/15   Fayrene HelperBowie Tran, PA-C  glucosamine-chondroitin 500-400 MG tablet Take  2 tablets by mouth daily.    Historical Provider, MD  lisinopril (PRINIVIL,ZESTRIL) 20 MG tablet Take 20 mg by mouth daily.    Historical Provider, MD  MAGNESIUM PO Take 1 tablet by mouth daily.    Historical Provider, MD  NOVOLOG 100 UNIT/ML injection Insulin pump 09/05/14   Historical Provider, MD  traMADol (ULTRAM) 50 MG tablet Take 1 tablet (50 mg total) by mouth every 6 (six) hours as needed. 06/20/15   Fayrene HelperBowie Tran, PA-C   BP 124/81 mmHg  Pulse 56  Temp(Src) 97.9 F (36.6 C) (Oral)  Resp 18  Ht 5\' 7"  (1.702 m)  Wt 190 lb (86.183 kg)  BMI 29.75 kg/m2  SpO2 100%   Physical Exam  General: Well-developed, well-nourished male in no acute distress; appearance consistent with age of record HENT: normocephalic; atraumatic Eyes: Normal appearance Neck: supple Heart: regular rate and rhythm Lungs: Normal respiratory effort and excursion Abdomen: soft; nondistended Extremities: No deformity; full range of motion Neurologic: Awake, alert and oriented; motor function intact in all extremities and symmetric; no facial droop Skin: Warm and dry; maculopapular and vesicular rash of the forearms with evidence of scratching Psychiatric: Normal mood and affect    ED Course  Procedures (including critical care time)   MDM  The dermatitis is sparse enough that topical treatment should be adequate. Given that he has diabetes wish to avoid systemic steroids.     Paula LibraJohn Chalsea Darko, MD 09/05/15 [phone]
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
